# Patient Record
Sex: Male | Born: 1970 | Race: White | Hispanic: No | Marital: Married | Smoking: Never smoker
Health system: Southern US, Community
[De-identification: ages and names within clinical notes are randomized; demographics above are authoritative.]

## PROBLEM LIST (undated history)

## (undated) DIAGNOSIS — F419 Anxiety disorder, unspecified: Secondary | ICD-10-CM

## (undated) DIAGNOSIS — E785 Hyperlipidemia, unspecified: Secondary | ICD-10-CM

## (undated) DIAGNOSIS — G4730 Sleep apnea, unspecified: Secondary | ICD-10-CM

## (undated) HISTORY — DX: Hyperlipidemia, unspecified: E78.5

## (undated) HISTORY — DX: Sleep apnea, unspecified: G47.30

## (undated) HISTORY — DX: Anxiety disorder, unspecified: F41.9

---

## 2007-05-02 ENCOUNTER — Encounter: Admission: RE | Admit: 2007-05-02 | Discharge: 2007-05-02 | Payer: Self-pay | Admitting: *Deleted

## 2014-06-15 ENCOUNTER — Ambulatory Visit (HOSPITAL_BASED_OUTPATIENT_CLINIC_OR_DEPARTMENT_OTHER): Payer: BC Managed Care – PPO | Attending: Family Medicine

## 2014-06-15 DIAGNOSIS — G4733 Obstructive sleep apnea (adult) (pediatric): Secondary | ICD-10-CM | POA: Insufficient documentation

## 2014-06-15 DIAGNOSIS — R0683 Snoring: Secondary | ICD-10-CM

## 2014-06-15 DIAGNOSIS — G4710 Hypersomnia, unspecified: Secondary | ICD-10-CM

## 2014-06-21 DIAGNOSIS — G4710 Hypersomnia, unspecified: Secondary | ICD-10-CM

## 2014-06-21 DIAGNOSIS — R0683 Snoring: Secondary | ICD-10-CM

## 2014-06-21 NOTE — Sleep Study (Signed)
   NAME: HELDER Mckay DATE OF BIRTH:  Jul 30, 1971 MEDICAL RECORD NUMBER [id number]  LOCATION: [facility]  PHYSICIAN: Encarnacion Scioneaux D  DATE OF STUDY: 06/15/2014  SLEEP STUDY TYPE: Nocturnal Polysomnogram               REFERRING PHYSICIAN: Aura Dials, MD  INDICATION FOR STUDY: Hypersomnia with sleep apnea  EPWORTH SLEEPINESS SCORE:   12/24 HEIGHT:   6 feet WEIGHT:   175 pounds  BMI 24 NECK SIZE: 17 in.  MEDICATIONS: Charted for review  SLEEP ARCHITECTURE: Total sleep time 307.5 minutes with sleep efficiency 82.1%. Stage I was 12.8%, stage II 75.4%, stage III absent, REM 11.7% of total sleep time. Sleep latency 7 minutes, REM latency 255 minutes, awake after sleep onset 60 minutes, arousal index 28.1, bedtime medication: None  RESPIRATORY DATA: Apnea hypopneas index (AHI) 8.2 per hour. 41 total events scored including 23 obstructive apneas, 1 central apnea, 1 mixed apnea, 16 hypopneas. Events were more common while supine. REM AHI 1.7 per hour. There were not enough early events to allow application of split protocol CPAP titration grade  OXYGEN DATA: Loud snoring with oxygen desaturation to a nadir of 86% and mean saturation 94.3% on room air.  CARDIAC DATA: Normal sinus rhythm  MOVEMENT/PARASOMNIA: No significant movement disturbance, bathroom x1  IMPRESSION/ RECOMMENDATION:   1) Mild obstructive sleep apnea/hypopneas syndrome, AHI 8.2 per hour. Events were more common while sleeping supine and towards the end of the night. Loud snoring was oxygen desaturation to a nadir at 86% and mean saturation 94.3% on room air. 2) There were not enough early events to meet protocol requirements for split CPAP titration. Events did cluster towards the end of the night. On an individual basis, treatment options might include an oral appliance or CPAP, encouragement to sleep off flat of back, and treatment for any significant nasal congestion.  Deneise Lever Diplomate, American Board of Sleep Medicine  ELECTRONICALLY SIGNED ON:  06/21/2014, 10:10 AM [place] PH: [phone]   FX: [phone] [place]

## 2016-09-14 ENCOUNTER — Other Ambulatory Visit: Payer: Self-pay | Admitting: Family Medicine

## 2016-09-14 DIAGNOSIS — R1011 Right upper quadrant pain: Secondary | ICD-10-CM

## 2016-09-16 ENCOUNTER — Ambulatory Visit
Admission: RE | Admit: 2016-09-16 | Discharge: 2016-09-16 | Disposition: A | Discharge status: Home / Self Care | Payer: BLUE CROSS/BLUE SHIELD | Source: Ambulatory Visit | Attending: Family Medicine | Admitting: Family Medicine

## 2016-09-16 DIAGNOSIS — R1011 Right upper quadrant pain: Secondary | ICD-10-CM

## 2019-03-15 ENCOUNTER — Telehealth: Payer: Self-pay

## 2019-03-15 ENCOUNTER — Other Ambulatory Visit: Payer: BLUE CROSS/BLUE SHIELD

## 2019-03-15 DIAGNOSIS — U071 COVID-19: Secondary | ICD-10-CM

## 2019-03-15 NOTE — Telephone Encounter (Signed)
rec'd call from Dr. Ival Bible office with request for COVID testing, due to exposure.  Office # [phone]; Fax [phone].  Phone call to pt. Spoke with pt's wife.  Scheduled pt. For COVID testing today at 2:30 PM at [facility].  Advised to wear a mask, and remain in car for testing.  Verb. Understanding.

## 2019-03-21 LAB — NOVEL CORONAVIRUS, NAA: SARS-CoV-2, NAA: NOT DETECTED

## 2019-09-23 ENCOUNTER — Ambulatory Visit: Payer: Managed Care, Other (non HMO) | Attending: Internal Medicine

## 2019-09-23 ENCOUNTER — Other Ambulatory Visit: Payer: Self-pay

## 2019-09-23 DIAGNOSIS — Z20822 Contact with and (suspected) exposure to covid-19: Secondary | ICD-10-CM

## 2019-09-24 LAB — NOVEL CORONAVIRUS, NAA: SARS-CoV-2, NAA: NOT DETECTED

## 2019-09-30 ENCOUNTER — Ambulatory Visit: Payer: Managed Care, Other (non HMO) | Attending: Internal Medicine

## 2019-09-30 DIAGNOSIS — Z20822 Contact with and (suspected) exposure to covid-19: Secondary | ICD-10-CM

## 2019-10-01 LAB — NOVEL CORONAVIRUS, NAA: SARS-CoV-2, NAA: NOT DETECTED

## 2020-05-30 ENCOUNTER — Other Ambulatory Visit: Payer: Self-pay

## 2020-05-30 ENCOUNTER — Ambulatory Visit (HOSPITAL_COMMUNITY)
Admission: EM | Admit: 2020-05-30 | Discharge: 2020-05-30 | Disposition: A | Discharge status: Home / Self Care | Payer: Managed Care, Other (non HMO)

## 2020-05-30 ENCOUNTER — Encounter (HOSPITAL_COMMUNITY): Payer: Self-pay

## 2020-05-30 ENCOUNTER — Ambulatory Visit (INDEPENDENT_AMBULATORY_CARE_PROVIDER_SITE_OTHER): Payer: Managed Care, Other (non HMO)

## 2020-05-30 DIAGNOSIS — S6992XA Unspecified injury of left wrist, hand and finger(s), initial encounter: Secondary | ICD-10-CM

## 2020-05-30 DIAGNOSIS — M25532 Pain in left wrist: Secondary | ICD-10-CM | POA: Diagnosis not present

## 2020-05-30 MED ORDER — IBUPROFEN 600 MG PO TABS: 600.0000 mg | ORAL_TABLET | Freq: Four times a day (QID) | ORAL | 0 refills | Status: DC | PRN

## 2020-05-30 NOTE — ED Triage Notes (Signed)
Pt states he was tearing his deck apart and fell 2 ft and caught hisself on the concrete with his hands today. Pt denies hitting head. Pt c/o 4/10 throbbing pain in left wrist that radiates up forearm to elbow. Pt has 2+ left radial pulse, cap refill less than 3 sec, 4/5 left grip strength. Pt states it's painful to his wrist and arm any time he used his hand.

## 2020-05-30 NOTE — Discharge Instructions (Addendum)
Your xray is normal.    Take the ibuprofen as prescribed.  Rest and elevate your wrist.  Apply ice packs 2-3 times a day for up to 20 minutes each.  Wear the Ace wrap as needed for comfort.    Follow up with your primary care provider or an orthopedist if you symptoms continue or worsen.

## 2020-05-30 NOTE — ED Provider Notes (Signed)
[facility]    CSN: [id number] Arrival date & time: 05/30/20  1626      History   Chief Complaint Chief Complaint  Patient presents with   Arm Injury    HPI Willie Mckay is a 49 y.o. male.   Patient presents with pain in his left wrist after falling earlier today.  He states he was working on his deck and his foot went through a board and he fell; he landed on his left arm.  He denies numbness, paresthesias, weakness but states it is painful to rotate his wrist.  No open wounds, erythema, ecchymosis, or other symptoms.  No head injury or loss of consciousness.  No treatments attempted at home.  The history is provided by the patient.    Past Medical History:  Diagnosis Date   Anxiety    Hyperlipidemia     There are no problems to display for this patient.   History reviewed. No pertinent surgical history.     Home Medications    Prior to Admission medications   Medication Sig Start Date End Date Taking? Authorizing Provider  Cholecalciferol (VITAMIN D) 125 MCG (5000 UT) CAPS Take by mouth.   Yes [provider]  Multiple Vitamin (MULTIVITAMIN) tablet Take 1 tablet by mouth daily.   Yes [provider]  atorvastatin (LIPITOR) 10 MG tablet Take 10 mg by mouth at bedtime. 04/25/20   [provider]  ibuprofen (ADVIL) 600 MG tablet Take 1 tablet (600 mg total) by mouth every 6 (six) hours as needed. 05/30/20   Sharion Balloon, NP  PARoxetine (PAXIL) 40 MG tablet Take 40 mg by mouth daily. 05/20/20   [provider]    Family History No family history on file.  Social History Social History   Tobacco Use   Smoking status: Never Smoker   Smokeless tobacco: Never Used  Substance Use Topics   Alcohol use: Yes    Alcohol/week: 3.0 standard drinks    Types: 2 Cans of beer, 1 Shots of liquor per week   Drug use: Never     Allergies   Patient has no known allergies.   Review of Systems Review of Systems    Constitutional: Negative for chills and fever.  HENT: Negative for ear pain and sore throat.   Eyes: Negative for pain and visual disturbance.  Respiratory: Negative for cough and shortness of breath.   Cardiovascular: Negative for chest pain and palpitations.  Gastrointestinal: Negative for abdominal pain and vomiting.  Genitourinary: Negative for dysuria and hematuria.  Musculoskeletal: Positive for arthralgias. Negative for back pain.  Skin: Negative for color change and rash.  Neurological: Negative for seizures, syncope, weakness and numbness.  All other systems reviewed and are negative.    Physical Exam Triage Vital Signs ED Triage Vitals  Enc Vitals Group     BP 05/30/20 1744 115/81     Pulse Rate 05/30/20 1744 97     Resp 05/30/20 1744 16     Temp 05/30/20 1744 99.2 F (37.3 C)     Temp Source 05/30/20 1744 Oral     SpO2 05/30/20 1744 97 %     Weight 05/30/20 1745 185 lb (83.9 kg)     Height 05/30/20 1745 6' (1.829 m)     Head Circumference --      Peak Flow --      Pain Score 05/30/20 1744 5     Pain Loc --      Pain  Edu? --      Excl. in [place]? --    No data found.  Updated Vital Signs BP 115/81    Pulse 97    Temp 99.2 F (37.3 C) (Oral)    Resp 16    Ht 6' (1.829 m)    Wt 185 lb (83.9 kg)    SpO2 97%    BMI 25.09 kg/m   Visual Acuity Right Eye Distance:   Left Eye Distance:   Bilateral Distance:    Right Eye Near:   Left Eye Near:    Bilateral Near:     Physical Exam Vitals and nursing note reviewed.  Constitutional:      General: He is not in acute distress.    Appearance: He is well-developed. He is not ill-appearing.  HENT:     Head: Normocephalic and atraumatic.     Mouth/Throat:     Mouth: Mucous membranes are moist.  Eyes:     Conjunctiva/sclera: Conjunctivae normal.  Cardiovascular:     Rate and Rhythm: Normal rate and regular rhythm.     Heart sounds: No murmur heard.   Pulmonary:     Effort: Pulmonary effort is normal. No  respiratory distress.     Breath sounds: Normal breath sounds.  Abdominal:     Palpations: Abdomen is soft.     Tenderness: There is no abdominal tenderness.  Musculoskeletal:        General: Swelling and tenderness present. No deformity.     Cervical back: Neck supple.     Comments: Left wrist: Tender to palpation, mild edema, sensation intact, range of motion limited by discomfort. 2+ pulses, sensation intact.    Skin:    General: Skin is warm and dry.     Capillary Refill: Capillary refill takes less than 2 seconds.     Findings: No bruising, erythema, lesion or rash.  Neurological:     General: No focal deficit present.     Mental Status: He is alert and oriented to person, place, and time.     Sensory: No sensory deficit.     Motor: No weakness.     Gait: Gait normal.  Psychiatric:        Mood and Affect: Mood normal.        Behavior: Behavior normal.      UC Treatments / Results  Labs (all labs ordered are listed, but only abnormal results are displayed) Labs Reviewed - No data to display  EKG   Radiology DG Wrist Complete Left  Result Date: 05/30/2020 CLINICAL DATA:  Pain post fall EXAM: LEFT WRIST - COMPLETE 3+ VIEW COMPARISON:  None FINDINGS: Osseous mineralization normal. Joint spaces preserved. No fracture, dislocation, or bone destruction. IMPRESSION: Normal exam. Electronically Signed   By: Lavonia Dana M.D.   On: 05/30/2020 18:06    Procedures Procedures (including critical care time)  Medications Ordered in UC Medications - No data to display  Initial Impression / Assessment and Plan / UC Course  I have reviewed the triage vital signs and the nursing notes.  Pertinent labs & imaging results that were available during my care of the patient were reviewed by me and considered in my medical decision making (see chart for details).   Left wrist pain.  X-ray negative.  Treating with ibuprofen, rest, elevation, ice packs, wrist splint.  Instructed patient to  follow-up with orthopedics if his symptoms are not improving.  Patient agrees to plan of care.   Final Clinical Impressions(s) /  UC Diagnoses   Final diagnoses:  Left wrist pain     Discharge Instructions     Your xray is normal.    Take the ibuprofen as prescribed.  Rest and elevate your wrist.  Apply ice packs 2-3 times a day for up to 20 minutes each.  Wear the Ace wrap as needed for comfort.    Follow up with your primary care provider or an orthopedist if you symptoms continue or worsen.        ED Prescriptions    Medication Sig Dispense Auth. Provider   ibuprofen (ADVIL) 600 MG tablet Take 1 tablet (600 mg total) by mouth every 6 (six) hours as needed. 30 tablet Sharion Balloon, NP     PDMP not reviewed this encounter.   Sharion Balloon, NP 05/30/20 1850

## 2020-12-11 ENCOUNTER — Ambulatory Visit (AMBULATORY_SURGERY_CENTER): Payer: Managed Care, Other (non HMO) | Admitting: *Deleted

## 2020-12-11 ENCOUNTER — Other Ambulatory Visit: Payer: Self-pay

## 2020-12-11 VITALS — Ht 72.0 in | Wt 185.0 lb

## 2020-12-11 DIAGNOSIS — Z1211 Encounter for screening for malignant neoplasm of colon: Secondary | ICD-10-CM

## 2020-12-11 NOTE — Progress Notes (Signed)
Patient is here in-person for PV. Patient denies any allergies to eggs or soy. Patient denies any problems with anesthesia/sedation. Patient denies any oxygen use at home. Patient denies taking any diet/weight loss medications or blood thinners. Patient is not being treated for MRSA or C-diff. Patient is aware of our care-partner policy and Covid-19 safety protocol. EMMI education assigned to the patient for the procedure, sent to MyChart.   Patient is fully COVID-19 vaccinated, per patient.    

## 2020-12-18 DIAGNOSIS — Z86010 Personal history of colonic polyps: Secondary | ICD-10-CM

## 2020-12-18 HISTORY — DX: Personal history of colonic polyps: Z86.010

## 2020-12-18 HISTORY — PX: COLONOSCOPY W/ POLYPECTOMY: SHX1380

## 2020-12-23 ENCOUNTER — Encounter: Payer: Self-pay | Admitting: Internal Medicine

## 2020-12-25 ENCOUNTER — Ambulatory Visit (AMBULATORY_SURGERY_CENTER): Payer: Managed Care, Other (non HMO) | Admitting: Internal Medicine

## 2020-12-25 ENCOUNTER — Encounter: Payer: Self-pay | Admitting: Internal Medicine

## 2020-12-25 ENCOUNTER — Other Ambulatory Visit: Payer: Self-pay

## 2020-12-25 VITALS — BP 121/84 | HR 70 | Temp 97.7°F | Resp 14 | Ht 72.0 in | Wt 185.0 lb

## 2020-12-25 DIAGNOSIS — D125 Benign neoplasm of sigmoid colon: Secondary | ICD-10-CM | POA: Diagnosis not present

## 2020-12-25 DIAGNOSIS — Z1211 Encounter for screening for malignant neoplasm of colon: Secondary | ICD-10-CM

## 2020-12-25 MED ORDER — SODIUM CHLORIDE 0.9 % IV SOLN: 500.0000 mL | Freq: Once | INTRAVENOUS | Status: AC

## 2020-12-25 NOTE — Patient Instructions (Addendum)
I found and removed one tiny polyp that looks benign. I will let you know pathology results and when to have another routine colonoscopy by mail and/or My Chart.  I appreciate the opportunity to care for you. Gatha Mayer, MD, FACG YOU HAD AN ENDOSCOPIC PROCEDURE TODAY AT [facility]:   Refer to the procedure report that was given to you for any specific questions about what was found during the examination.  If the procedure report does not answer your questions, please call your gastroenterologist to clarify.  If you requested that your care partner not be given the details of your procedure findings, then the procedure report has been included in a sealed envelope for you to review at your convenience later.  YOU SHOULD EXPECT: Some feelings of bloating in the abdomen. Passage of more gas than usual.  Walking can help get rid of the air that was put into your GI tract during the procedure and reduce the bloating. If you had a lower endoscopy (such as a colonoscopy or flexible sigmoidoscopy) you may notice spotting of blood in your stool or on the toilet paper. If you underwent a bowel prep for your procedure, you may not have a normal bowel movement for a few days.  Please Note:  You might notice some irritation and congestion in your nose or some drainage.  This is from the oxygen used during your procedure.  There is no need for concern and it should clear up in a day or so.  SYMPTOMS TO REPORT IMMEDIATELY:   Following lower endoscopy (colonoscopy or flexible sigmoidoscopy):  Excessive amounts of blood in the stool  Significant tenderness or worsening of abdominal pains  Swelling of the abdomen that is new, acute  Fever of 100F or higher   For urgent or emergent issues, a gastroenterologist can be reached at any hour by calling [phone]. Do not use MyChart messaging for urgent concerns.    DIET:  We do recommend a small meal at first, but then you may proceed  to your regular diet.  Drink plenty of fluids but you should avoid alcoholic beverages for 24 hours.  MEDICATIONS: Continue present medications.  Please see handouts given to you by your recovery nurse.  Thank you for allowing [place] to provide for your healthcare needs today.  ACTIVITY:  You should plan to take it easy for the rest of today and you should NOT DRIVE or use heavy machinery until tomorrow (because of the sedation medicines used during the test).    FOLLOW UP: Our staff will call the number listed on your records 48-72 hours following your procedure to check on you and address any questions or concerns that you may have regarding the information given to you following your procedure. If we do not reach you, we will leave a message.  We will attempt to reach you two times.  During this call, we will ask if you have developed any symptoms of COVID 19. If you develop any symptoms (ie: fever, flu-like symptoms, shortness of breath, cough etc.) before then, please call [phone].  If you test positive for Covid 19 in the 2 weeks post procedure, please call and report this information to [place].    If any biopsies were taken you will be contacted by phone or by letter within the next 1-3 weeks.  Please call us at [phone] if you have not heard about the biopsies in 3 weeks.    SIGNATURES/CONFIDENTIALITY: You and/or  your care partner have signed paperwork which will be entered into your electronic medical record.  These signatures attest to the fact that that the information above on your After Visit Summary has been reviewed and is understood.  Full responsibility of the confidentiality of this discharge information lies with you and/or your care-partner.

## 2020-12-25 NOTE — Progress Notes (Signed)
Called to room to assist during endoscopic procedure.  Patient ID and intended procedure confirmed with present staff. Received instructions for my participation in the procedure from the performing physician.  

## 2020-12-25 NOTE — Progress Notes (Signed)
To PACU, VSS. Report to [email or web address] 

## 2020-12-25 NOTE — Progress Notes (Signed)
Pt's states no medical or surgical changes since previsit or office visit.  SS Vitals and IV.

## 2020-12-25 NOTE — Op Note (Signed)
[place] Patient Name: Willie Mckay Procedure Date: 12/25/2020 8:29 AM MRN: [id number] Endoscopist: Gatha Mayer , MD Age: 50 Referring MD:  Date of Birth: 1970/11/14 Gender: Male Account #: [id number] Procedure:                Colonoscopy Indications:              Screening for colorectal malignant neoplasm, This                            is the patient's first colonoscopy Medicines:                Propofol per Anesthesia, Monitored Anesthesia Care Procedure:                Pre-Anesthesia Assessment:                           - Prior to the procedure, a History and Physical                            was performed, and patient medications and                            allergies were reviewed. The patient's tolerance of                            previous anesthesia was also reviewed. The risks                            and benefits of the procedure and the sedation                            options and risks were discussed with the patient.                            All questions were answered, and informed consent                            was obtained. Prior Anticoagulants: The patient has                            taken no previous anticoagulant or antiplatelet                            agents. ASA Grade Assessment: II - A patient with                            mild systemic disease. After reviewing the risks                            and benefits, the patient was deemed in                            satisfactory condition to undergo the procedure.  After obtaining informed consent, the colonoscope                            was passed under direct vision. Throughout the                            procedure, the patient's blood pressure, pulse, and                            oxygen saturations were monitored continuously. The                            Olympus [id number]) [id number] was introduced                             through the anus and advanced to the the cecum,                            identified by appendiceal orifice and ileocecal                            valve. The colonoscopy was performed without                            difficulty. The patient tolerated the procedure                            well. The quality of the bowel preparation was                            adequate. The bowel preparation used was Miralax                            via split dose instruction. The ileocecal valve,                            appendiceal orifice, and rectum were photographed. Scope In: 8:38:16 AM Scope Out: 9:06:32 AM Scope Withdrawal Time: 0 hours 22 minutes 36 seconds  Total Procedure Duration: 0 hours 28 minutes 16 seconds  Findings:                 The perianal and digital rectal examinations were                            normal. Pertinent negatives include normal prostate                            (size, shape, and consistency).                           A diminutive polyp was found in the sigmoid colon.                            The polyp was sessile. The polyp was removed with a  cold snare. Resection and retrieval were complete.                            Verification of patient identification for the                            specimen was done. Estimated blood loss was minimal.                           The exam was otherwise without abnormality on                            direct and retroflexion views. Complications:            No immediate complications. Estimated Blood Loss:     Estimated blood loss was minimal. Impression:               - One diminutive polyp in the sigmoid colon,                            removed with a cold snare. Resected and retrieved.                           - The examination was otherwise normal on direct                            and retroflexion views. Recommendation:           - Patient has a contact number available for                             emergencies. The signs and symptoms of potential                            delayed complications were discussed with the                            patient. Return to normal activities tomorrow.                            Written discharge instructions were provided to the                            patient.                           - Resume previous diet.                           - Continue present medications.                           - Repeat colonoscopy is recommended. The                            colonoscopy date will be determined after pathology  results from today's exam become available for                            review. better fiber/seed restriction advance and                            consider different prep Gatha Mayer, MD 12/25/2020 9:10:42 AM This report has been signed electronically.

## 2020-12-29 ENCOUNTER — Telehealth: Payer: Self-pay | Admitting: *Deleted

## 2020-12-29 NOTE — Telephone Encounter (Signed)
  Follow up Call-  Call back number 12/25/2020  Post procedure Call Back phone  # [phone]  Permission to leave phone message Yes  Some recent data might be hidden     Patient questions:  Do you have a fever, pain , or abdominal swelling? No. Pain Score  0 *  Have you tolerated food without any problems? Yes.    Have you been able to return to your normal activities? Yes.    Do you have any questions about your discharge instructions: Diet   No. Medications  no Follow up visit  No.  Do you have questions or concerns about your Care? No.  Actions: * If pain score is 4 or above: No action needed, pain <4.  1. Have you developed a fever since your procedure? no  2.   Have you had an respiratory symptoms (SOB or cough) since your procedure? no  3.   Have you tested positive for COVID 19 since your procedure no  4.   Have you had any family members/close contacts diagnosed with the COVID 19 since your procedure?  no   If yes to any of these questions please route to Joylene John, RN and Joella Prince, RN

## 2020-12-31 ENCOUNTER — Encounter: Payer: Managed Care, Other (non HMO) | Admitting: Internal Medicine

## 2021-01-05 ENCOUNTER — Encounter: Payer: Self-pay | Admitting: Internal Medicine

## 2021-02-19 IMAGING — DX DG WRIST COMPLETE 3+V*L*
4 series · 4 of 4 positions shown · non-contrast
Comparison: None

CLINICAL DATA: Pain post fall

EXAM:
LEFT WRIST - COMPLETE 3+ VIEW

[wrist pa]
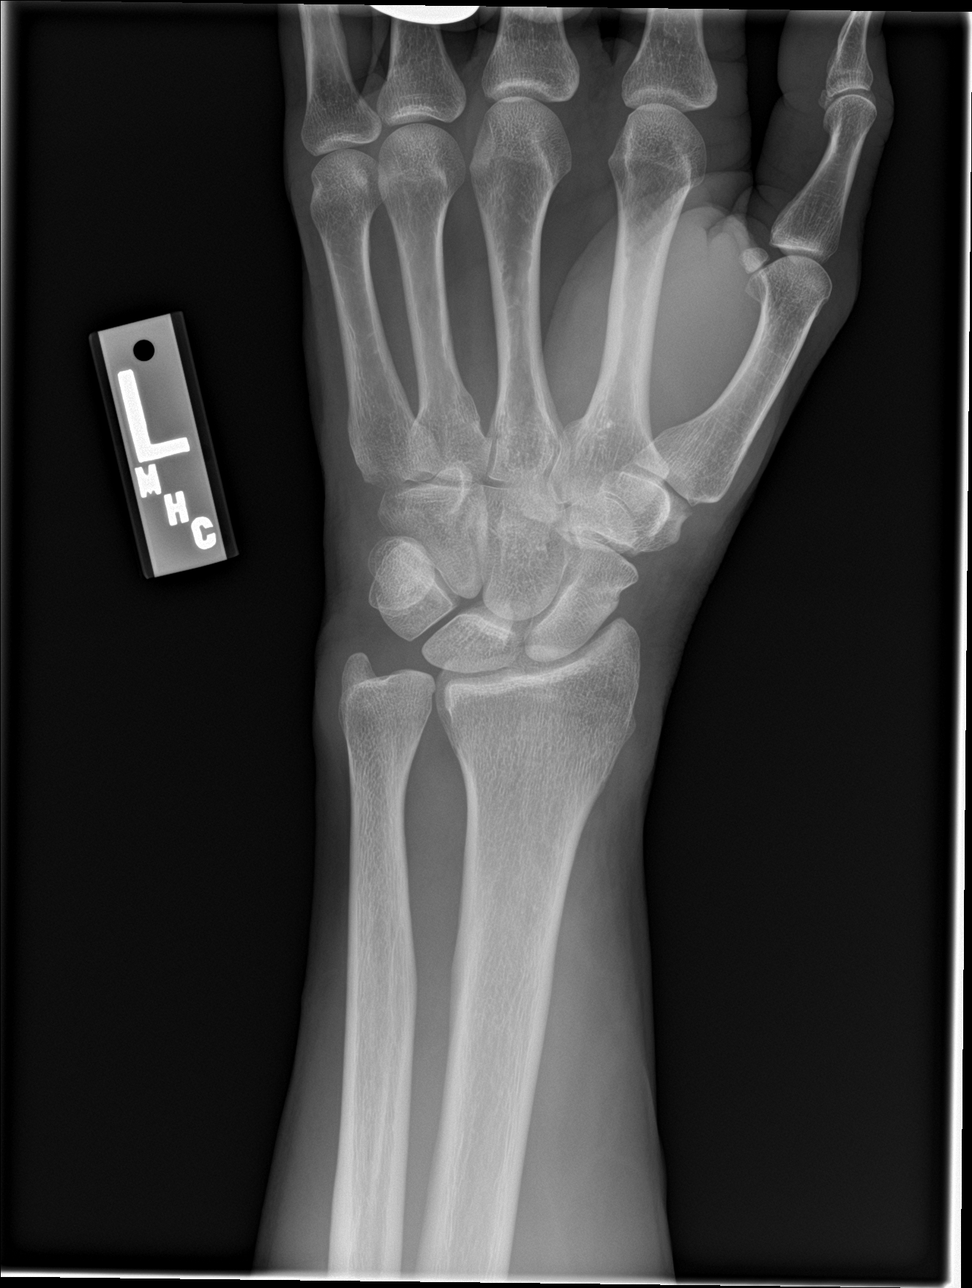

[wrist navicular]
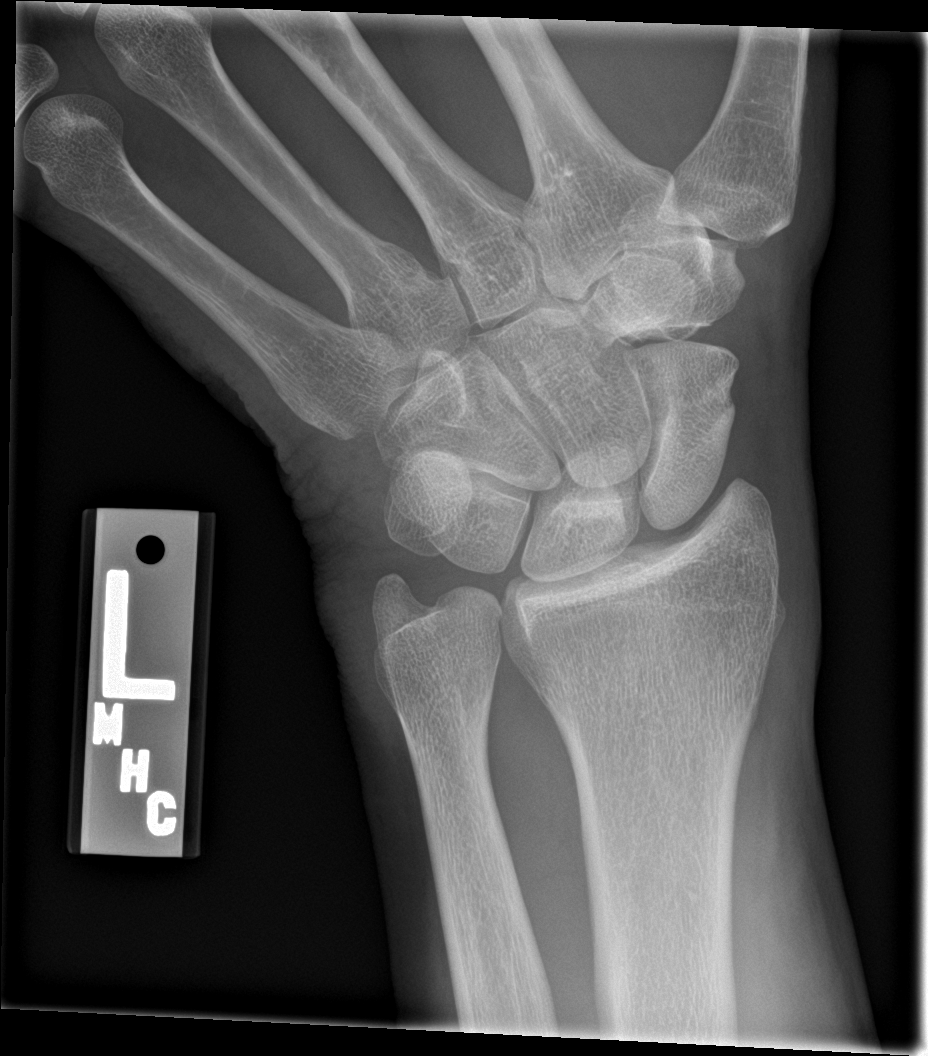

[wrist obl]
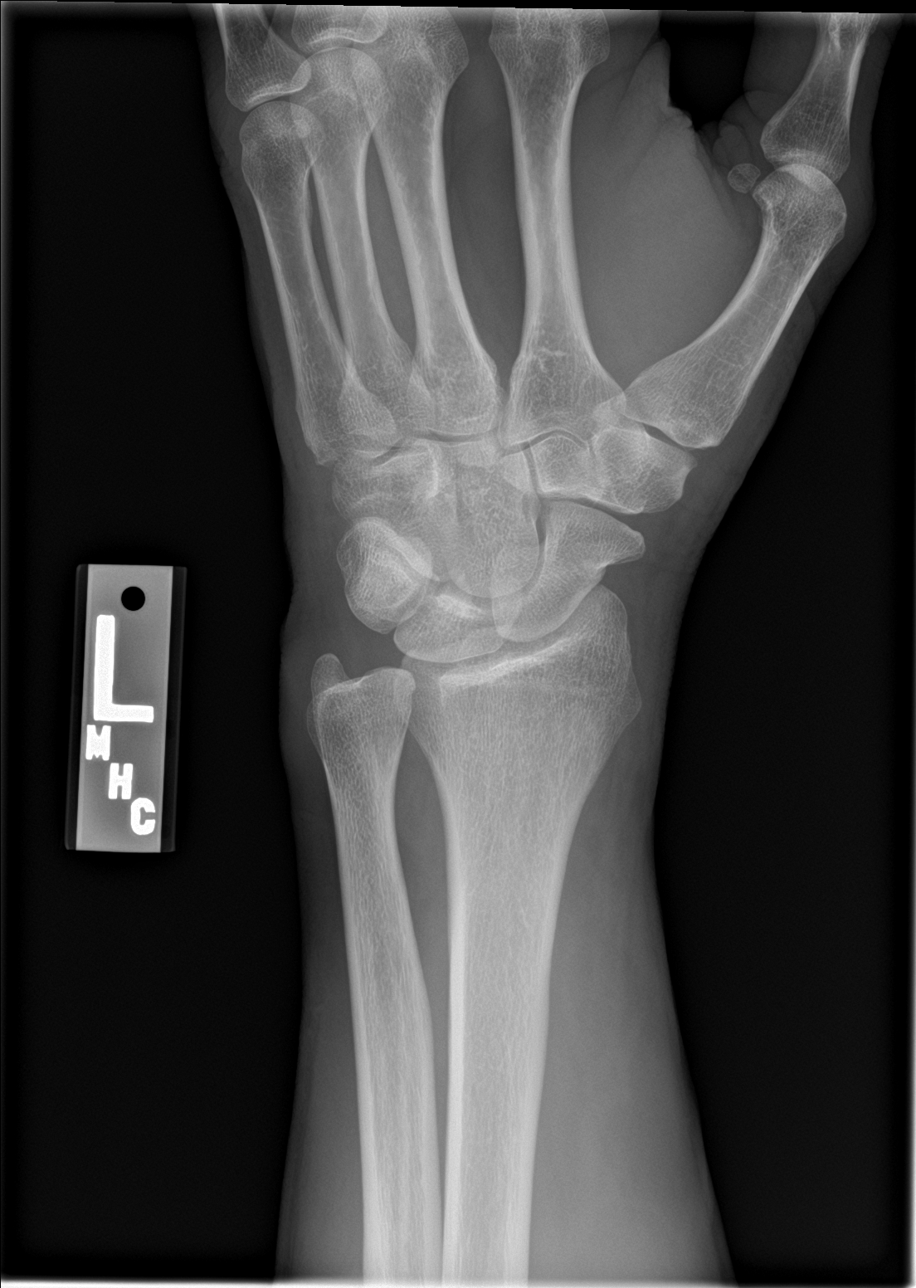

[wrist lat]
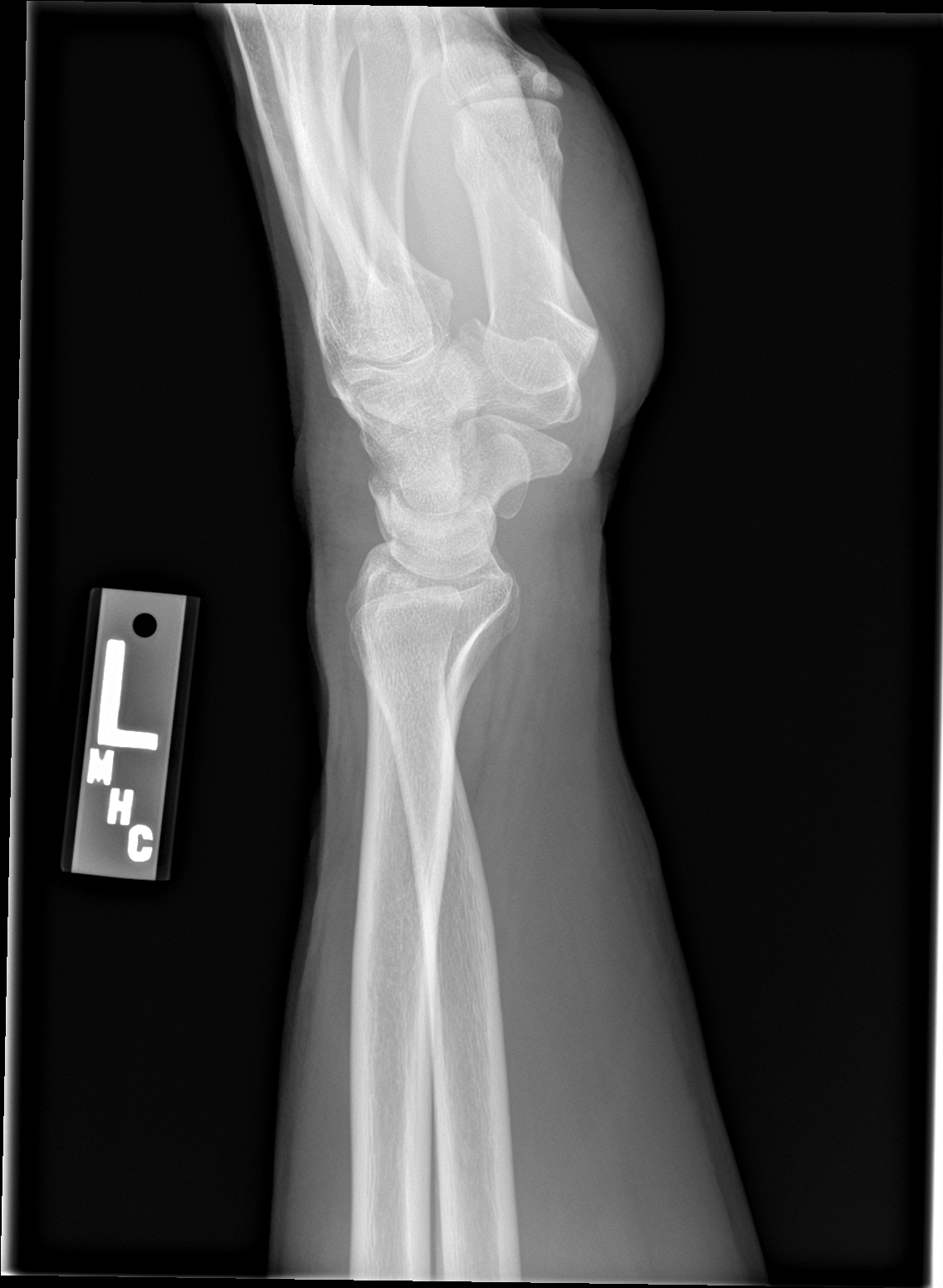

[4 of 4 positions shown; findings below may reference images not displayed]

FINDINGS: Osseous mineralization normal.

Joint spaces preserved.

No fracture, dislocation, or bone destruction.
IMPRESSION: Normal exam.

## 2024-05-22 ENCOUNTER — Other Ambulatory Visit (HOSPITAL_BASED_OUTPATIENT_CLINIC_OR_DEPARTMENT_OTHER): Payer: Self-pay

## 2024-05-22 MED ORDER — PAROXETINE HCL 40 MG PO TABS
40.0000 mg | ORAL_TABLET | Freq: Every day | ORAL | 3 refills | Status: AC
  Filled 2024-09-16 (×2): qty 90, 90d supply, fill #0
  Filled 2024-09-28: qty 90, 90d supply, fill #1

## 2024-05-22 MED ORDER — ATORVASTATIN CALCIUM 20 MG PO TABS
20.0000 mg | ORAL_TABLET | Freq: Every day | ORAL | 1 refills | Status: AC
  Filled 2024-08-02: qty 90, 90d supply, fill #0
  Filled 2024-09-28 – 2024-10-25 (×2): qty 90, 90d supply, fill #1

## 2024-06-28 ENCOUNTER — Other Ambulatory Visit (HOSPITAL_BASED_OUTPATIENT_CLINIC_OR_DEPARTMENT_OTHER): Payer: Self-pay

## 2024-06-28 MED ORDER — TAMSULOSIN HCL 0.4 MG PO CAPS
0.4000 mg | ORAL_CAPSULE | Freq: Every day | ORAL | 11 refills | Status: AC
  Filled 2024-06-28: qty 30, 30d supply, fill #0
  Filled 2024-07-02: qty 90, 90d supply, fill #0
  Filled 2024-09-28 (×2): qty 90, 90d supply, fill #1

## 2024-07-02 ENCOUNTER — Other Ambulatory Visit (HOSPITAL_BASED_OUTPATIENT_CLINIC_OR_DEPARTMENT_OTHER): Payer: Self-pay

## 2024-07-18 ENCOUNTER — Other Ambulatory Visit (HOSPITAL_BASED_OUTPATIENT_CLINIC_OR_DEPARTMENT_OTHER): Payer: Self-pay

## 2024-07-18 MED ORDER — TAMSULOSIN HCL 0.4 MG PO CAPS
0.8000 mg | ORAL_CAPSULE | Freq: Every day | ORAL | 11 refills | Status: AC
  Filled 2024-09-16: qty 60, 30d supply, fill #0
  Filled 2024-09-28: qty 60, 30d supply, fill #1

## 2024-08-02 ENCOUNTER — Other Ambulatory Visit (HOSPITAL_BASED_OUTPATIENT_CLINIC_OR_DEPARTMENT_OTHER): Payer: Self-pay

## 2024-09-06 ENCOUNTER — Other Ambulatory Visit (HOSPITAL_BASED_OUTPATIENT_CLINIC_OR_DEPARTMENT_OTHER): Payer: Self-pay

## 2024-09-06 MED ORDER — GEMTESA 75 MG PO TABS
75.0000 mg | ORAL_TABLET | Freq: Every day | ORAL | 11 refills | Status: AC
  Filled 2024-09-06 – 2024-09-28 (×2): qty 30, 30d supply, fill #0

## 2024-09-07 ENCOUNTER — Other Ambulatory Visit (HOSPITAL_BASED_OUTPATIENT_CLINIC_OR_DEPARTMENT_OTHER): Payer: Self-pay

## 2024-09-09 ENCOUNTER — Other Ambulatory Visit (HOSPITAL_BASED_OUTPATIENT_CLINIC_OR_DEPARTMENT_OTHER): Payer: Self-pay

## 2024-09-13 ENCOUNTER — Other Ambulatory Visit (HOSPITAL_BASED_OUTPATIENT_CLINIC_OR_DEPARTMENT_OTHER): Payer: Self-pay

## 2024-09-16 ENCOUNTER — Other Ambulatory Visit: Payer: Self-pay

## 2024-09-16 ENCOUNTER — Other Ambulatory Visit (HOSPITAL_BASED_OUTPATIENT_CLINIC_OR_DEPARTMENT_OTHER): Payer: Self-pay

## 2024-09-17 ENCOUNTER — Other Ambulatory Visit (HOSPITAL_BASED_OUTPATIENT_CLINIC_OR_DEPARTMENT_OTHER): Payer: Self-pay

## 2024-09-18 ENCOUNTER — Other Ambulatory Visit (HOSPITAL_BASED_OUTPATIENT_CLINIC_OR_DEPARTMENT_OTHER): Payer: Self-pay

## 2024-09-24 ENCOUNTER — Other Ambulatory Visit (HOSPITAL_BASED_OUTPATIENT_CLINIC_OR_DEPARTMENT_OTHER): Payer: Self-pay

## 2024-09-26 ENCOUNTER — Other Ambulatory Visit (HOSPITAL_BASED_OUTPATIENT_CLINIC_OR_DEPARTMENT_OTHER): Payer: Self-pay

## 2024-09-28 ENCOUNTER — Other Ambulatory Visit (HOSPITAL_BASED_OUTPATIENT_CLINIC_OR_DEPARTMENT_OTHER): Payer: Self-pay

## 2024-10-03 ENCOUNTER — Other Ambulatory Visit (HOSPITAL_BASED_OUTPATIENT_CLINIC_OR_DEPARTMENT_OTHER): Payer: Self-pay

## 2024-10-16 ENCOUNTER — Other Ambulatory Visit (HOSPITAL_BASED_OUTPATIENT_CLINIC_OR_DEPARTMENT_OTHER): Payer: Self-pay

## 2024-10-22 ENCOUNTER — Other Ambulatory Visit (HOSPITAL_BASED_OUTPATIENT_CLINIC_OR_DEPARTMENT_OTHER): Payer: Self-pay
